# Patient Record
Sex: Male | Born: 1970
Health system: Southern US, Community
[De-identification: ages and names within clinical notes are randomized; demographics above are authoritative.]

## PROBLEM LIST (undated history)

## (undated) DIAGNOSIS — M5136 Other intervertebral disc degeneration, lumbar region: Secondary | ICD-10-CM

## (undated) DIAGNOSIS — M5126 Other intervertebral disc displacement, lumbar region: Secondary | ICD-10-CM

## (undated) HISTORY — PX: KNEE SURGERY: SHX244

## (undated) HISTORY — PX: BACK SURGERY: SHX140

---

## 2010-10-05 ENCOUNTER — Ambulatory Visit: Payer: Self-pay | Admitting: Family Medicine

## 2011-03-28 ENCOUNTER — Emergency Department: Payer: Self-pay | Admitting: Emergency Medicine

## 2012-12-31 ENCOUNTER — Emergency Department: Payer: Self-pay | Admitting: Emergency Medicine

## 2013-05-12 DIAGNOSIS — IMO0001 Reserved for inherently not codable concepts without codable children: Secondary | ICD-10-CM | POA: Diagnosis not present

## 2013-05-12 DIAGNOSIS — M545 Low back pain, unspecified: Secondary | ICD-10-CM | POA: Diagnosis not present

## 2013-05-12 DIAGNOSIS — Z5181 Encounter for therapeutic drug level monitoring: Secondary | ICD-10-CM | POA: Diagnosis not present

## 2013-05-12 DIAGNOSIS — Z79899 Other long term (current) drug therapy: Secondary | ICD-10-CM | POA: Diagnosis not present

## 2013-05-12 DIAGNOSIS — M62838 Other muscle spasm: Secondary | ICD-10-CM | POA: Diagnosis not present

## 2013-06-20 DIAGNOSIS — R109 Unspecified abdominal pain: Secondary | ICD-10-CM | POA: Diagnosis not present

## 2013-06-20 DIAGNOSIS — R630 Anorexia: Secondary | ICD-10-CM | POA: Diagnosis not present

## 2013-06-20 DIAGNOSIS — R112 Nausea with vomiting, unspecified: Secondary | ICD-10-CM | POA: Diagnosis not present

## 2013-08-18 DIAGNOSIS — M1710 Unilateral primary osteoarthritis, unspecified knee: Secondary | ICD-10-CM | POA: Diagnosis not present

## 2013-08-18 DIAGNOSIS — IMO0001 Reserved for inherently not codable concepts without codable children: Secondary | ICD-10-CM | POA: Diagnosis not present

## 2013-08-18 DIAGNOSIS — M62838 Other muscle spasm: Secondary | ICD-10-CM | POA: Diagnosis not present

## 2013-11-21 DIAGNOSIS — K219 Gastro-esophageal reflux disease without esophagitis: Secondary | ICD-10-CM | POA: Diagnosis not present

## 2013-11-21 DIAGNOSIS — R109 Unspecified abdominal pain: Secondary | ICD-10-CM | POA: Diagnosis not present

## 2013-11-21 DIAGNOSIS — M549 Dorsalgia, unspecified: Secondary | ICD-10-CM | POA: Diagnosis not present

## 2013-11-21 DIAGNOSIS — R079 Chest pain, unspecified: Secondary | ICD-10-CM | POA: Diagnosis not present

## 2013-11-23 DIAGNOSIS — G8929 Other chronic pain: Secondary | ICD-10-CM | POA: Diagnosis not present

## 2013-11-23 DIAGNOSIS — M549 Dorsalgia, unspecified: Secondary | ICD-10-CM | POA: Diagnosis not present

## 2013-12-01 DIAGNOSIS — Z79899 Other long term (current) drug therapy: Secondary | ICD-10-CM | POA: Diagnosis not present

## 2013-12-01 DIAGNOSIS — M545 Low back pain, unspecified: Secondary | ICD-10-CM | POA: Diagnosis not present

## 2013-12-01 DIAGNOSIS — Z5181 Encounter for therapeutic drug level monitoring: Secondary | ICD-10-CM | POA: Diagnosis not present

## 2013-12-15 DIAGNOSIS — M1710 Unilateral primary osteoarthritis, unspecified knee: Secondary | ICD-10-CM | POA: Diagnosis not present

## 2013-12-15 DIAGNOSIS — IMO0001 Reserved for inherently not codable concepts without codable children: Secondary | ICD-10-CM | POA: Diagnosis not present

## 2014-01-08 DIAGNOSIS — S83242A Other tear of medial meniscus, current injury, left knee, initial encounter: Secondary | ICD-10-CM | POA: Diagnosis not present

## 2014-01-08 DIAGNOSIS — M25562 Pain in left knee: Secondary | ICD-10-CM | POA: Diagnosis not present

## 2014-02-02 DIAGNOSIS — Z79891 Long term (current) use of opiate analgesic: Secondary | ICD-10-CM | POA: Diagnosis not present

## 2014-02-02 DIAGNOSIS — M545 Low back pain: Secondary | ICD-10-CM | POA: Diagnosis not present

## 2014-02-09 DIAGNOSIS — M545 Low back pain: Secondary | ICD-10-CM | POA: Diagnosis not present

## 2014-02-09 DIAGNOSIS — M5136 Other intervertebral disc degeneration, lumbar region: Secondary | ICD-10-CM | POA: Diagnosis not present

## 2014-02-09 DIAGNOSIS — M25562 Pain in left knee: Secondary | ICD-10-CM | POA: Diagnosis not present

## 2014-02-19 DIAGNOSIS — M23222 Derangement of posterior horn of medial meniscus due to old tear or injury, left knee: Secondary | ICD-10-CM | POA: Diagnosis not present

## 2014-03-09 DIAGNOSIS — Z01818 Encounter for other preprocedural examination: Secondary | ICD-10-CM | POA: Diagnosis not present

## 2014-03-09 DIAGNOSIS — M23222 Derangement of posterior horn of medial meniscus due to old tear or injury, left knee: Secondary | ICD-10-CM | POA: Diagnosis not present

## 2014-03-13 DIAGNOSIS — S83207A Unspecified tear of unspecified meniscus, current injury, left knee, initial encounter: Secondary | ICD-10-CM | POA: Diagnosis not present

## 2014-03-13 DIAGNOSIS — M6752 Plica syndrome, left knee: Secondary | ICD-10-CM | POA: Diagnosis not present

## 2014-03-13 DIAGNOSIS — M23352 Other meniscus derangements, posterior horn of lateral meniscus, left knee: Secondary | ICD-10-CM | POA: Diagnosis not present

## 2014-03-13 DIAGNOSIS — M23252 Derangement of posterior horn of lateral meniscus due to old tear or injury, left knee: Secondary | ICD-10-CM | POA: Diagnosis not present

## 2014-03-13 DIAGNOSIS — M6751 Plica syndrome, right knee: Secondary | ICD-10-CM | POA: Diagnosis not present

## 2014-03-13 DIAGNOSIS — M67462 Ganglion, left knee: Secondary | ICD-10-CM | POA: Diagnosis not present

## 2014-03-16 DIAGNOSIS — M25562 Pain in left knee: Secondary | ICD-10-CM | POA: Diagnosis not present

## 2014-03-16 DIAGNOSIS — M545 Low back pain: Secondary | ICD-10-CM | POA: Diagnosis not present

## 2014-03-20 DIAGNOSIS — M23222 Derangement of posterior horn of medial meniscus due to old tear or injury, left knee: Secondary | ICD-10-CM | POA: Diagnosis not present

## 2014-05-16 ENCOUNTER — Ambulatory Visit: Payer: Self-pay | Admitting: Family Medicine

## 2014-05-16 DIAGNOSIS — J40 Bronchitis, not specified as acute or chronic: Secondary | ICD-10-CM | POA: Diagnosis not present

## 2014-05-16 DIAGNOSIS — R0789 Other chest pain: Secondary | ICD-10-CM | POA: Diagnosis not present

## 2014-05-16 DIAGNOSIS — B349 Viral infection, unspecified: Secondary | ICD-10-CM | POA: Diagnosis not present

## 2014-05-16 DIAGNOSIS — R05 Cough: Secondary | ICD-10-CM | POA: Diagnosis not present

## 2014-05-16 DIAGNOSIS — R079 Chest pain, unspecified: Secondary | ICD-10-CM | POA: Diagnosis not present

## 2014-07-18 DIAGNOSIS — M25562 Pain in left knee: Secondary | ICD-10-CM | POA: Diagnosis not present

## 2014-08-03 DIAGNOSIS — M545 Low back pain: Secondary | ICD-10-CM | POA: Diagnosis not present

## 2014-08-03 DIAGNOSIS — Z79891 Long term (current) use of opiate analgesic: Secondary | ICD-10-CM | POA: Diagnosis not present

## 2014-08-03 DIAGNOSIS — M25562 Pain in left knee: Secondary | ICD-10-CM | POA: Diagnosis not present

## 2014-08-07 DIAGNOSIS — M545 Low back pain: Secondary | ICD-10-CM | POA: Diagnosis not present

## 2014-08-07 DIAGNOSIS — M25562 Pain in left knee: Secondary | ICD-10-CM | POA: Diagnosis not present

## 2014-08-29 DIAGNOSIS — M7652 Patellar tendinitis, left knee: Secondary | ICD-10-CM | POA: Diagnosis not present

## 2014-08-29 DIAGNOSIS — M25562 Pain in left knee: Secondary | ICD-10-CM | POA: Diagnosis not present

## 2014-12-14 DIAGNOSIS — M7652 Patellar tendinitis, left knee: Secondary | ICD-10-CM | POA: Diagnosis not present

## 2014-12-14 DIAGNOSIS — M5136 Other intervertebral disc degeneration, lumbar region: Secondary | ICD-10-CM | POA: Diagnosis not present

## 2014-12-14 DIAGNOSIS — Z79891 Long term (current) use of opiate analgesic: Secondary | ICD-10-CM | POA: Diagnosis not present

## 2014-12-14 DIAGNOSIS — M545 Low back pain: Secondary | ICD-10-CM | POA: Diagnosis not present

## 2014-12-14 DIAGNOSIS — M25562 Pain in left knee: Secondary | ICD-10-CM | POA: Diagnosis not present

## 2015-04-22 DIAGNOSIS — M5136 Other intervertebral disc degeneration, lumbar region: Secondary | ICD-10-CM | POA: Diagnosis not present

## 2015-04-22 DIAGNOSIS — M25562 Pain in left knee: Secondary | ICD-10-CM | POA: Diagnosis not present

## 2015-04-22 DIAGNOSIS — M7652 Patellar tendinitis, left knee: Secondary | ICD-10-CM | POA: Diagnosis not present

## 2015-04-22 DIAGNOSIS — M545 Low back pain: Secondary | ICD-10-CM | POA: Diagnosis not present

## 2015-07-19 DIAGNOSIS — M25562 Pain in left knee: Secondary | ICD-10-CM | POA: Diagnosis not present

## 2015-07-19 DIAGNOSIS — M7652 Patellar tendinitis, left knee: Secondary | ICD-10-CM | POA: Diagnosis not present

## 2015-07-19 DIAGNOSIS — M5136 Other intervertebral disc degeneration, lumbar region: Secondary | ICD-10-CM | POA: Diagnosis not present

## 2015-07-19 DIAGNOSIS — M545 Low back pain: Secondary | ICD-10-CM | POA: Diagnosis not present

## 2015-07-26 DIAGNOSIS — M47816 Spondylosis without myelopathy or radiculopathy, lumbar region: Secondary | ICD-10-CM | POA: Diagnosis not present

## 2015-07-26 DIAGNOSIS — Z981 Arthrodesis status: Secondary | ICD-10-CM | POA: Diagnosis not present

## 2015-07-26 DIAGNOSIS — M5137 Other intervertebral disc degeneration, lumbosacral region: Secondary | ICD-10-CM | POA: Diagnosis not present

## 2015-07-26 DIAGNOSIS — M5126 Other intervertebral disc displacement, lumbar region: Secondary | ICD-10-CM | POA: Diagnosis not present

## 2015-07-26 DIAGNOSIS — M5136 Other intervertebral disc degeneration, lumbar region: Secondary | ICD-10-CM | POA: Diagnosis not present

## 2015-08-16 DIAGNOSIS — M5136 Other intervertebral disc degeneration, lumbar region: Secondary | ICD-10-CM | POA: Diagnosis not present

## 2015-11-15 DIAGNOSIS — Z79891 Long term (current) use of opiate analgesic: Secondary | ICD-10-CM | POA: Diagnosis not present

## 2015-11-15 DIAGNOSIS — M5136 Other intervertebral disc degeneration, lumbar region: Secondary | ICD-10-CM | POA: Diagnosis not present

## 2015-12-13 DIAGNOSIS — M5136 Other intervertebral disc degeneration, lumbar region: Secondary | ICD-10-CM | POA: Diagnosis not present

## 2016-01-30 IMAGING — CR DG CHEST 2V
2 series · 2 of 2 positions shown · non-contrast
Comparison: None

CLINICAL DATA: Cough for 1 day getting worse, chest pain and
tightness

EXAM:
CHEST  2 VIEW

[chest pa]
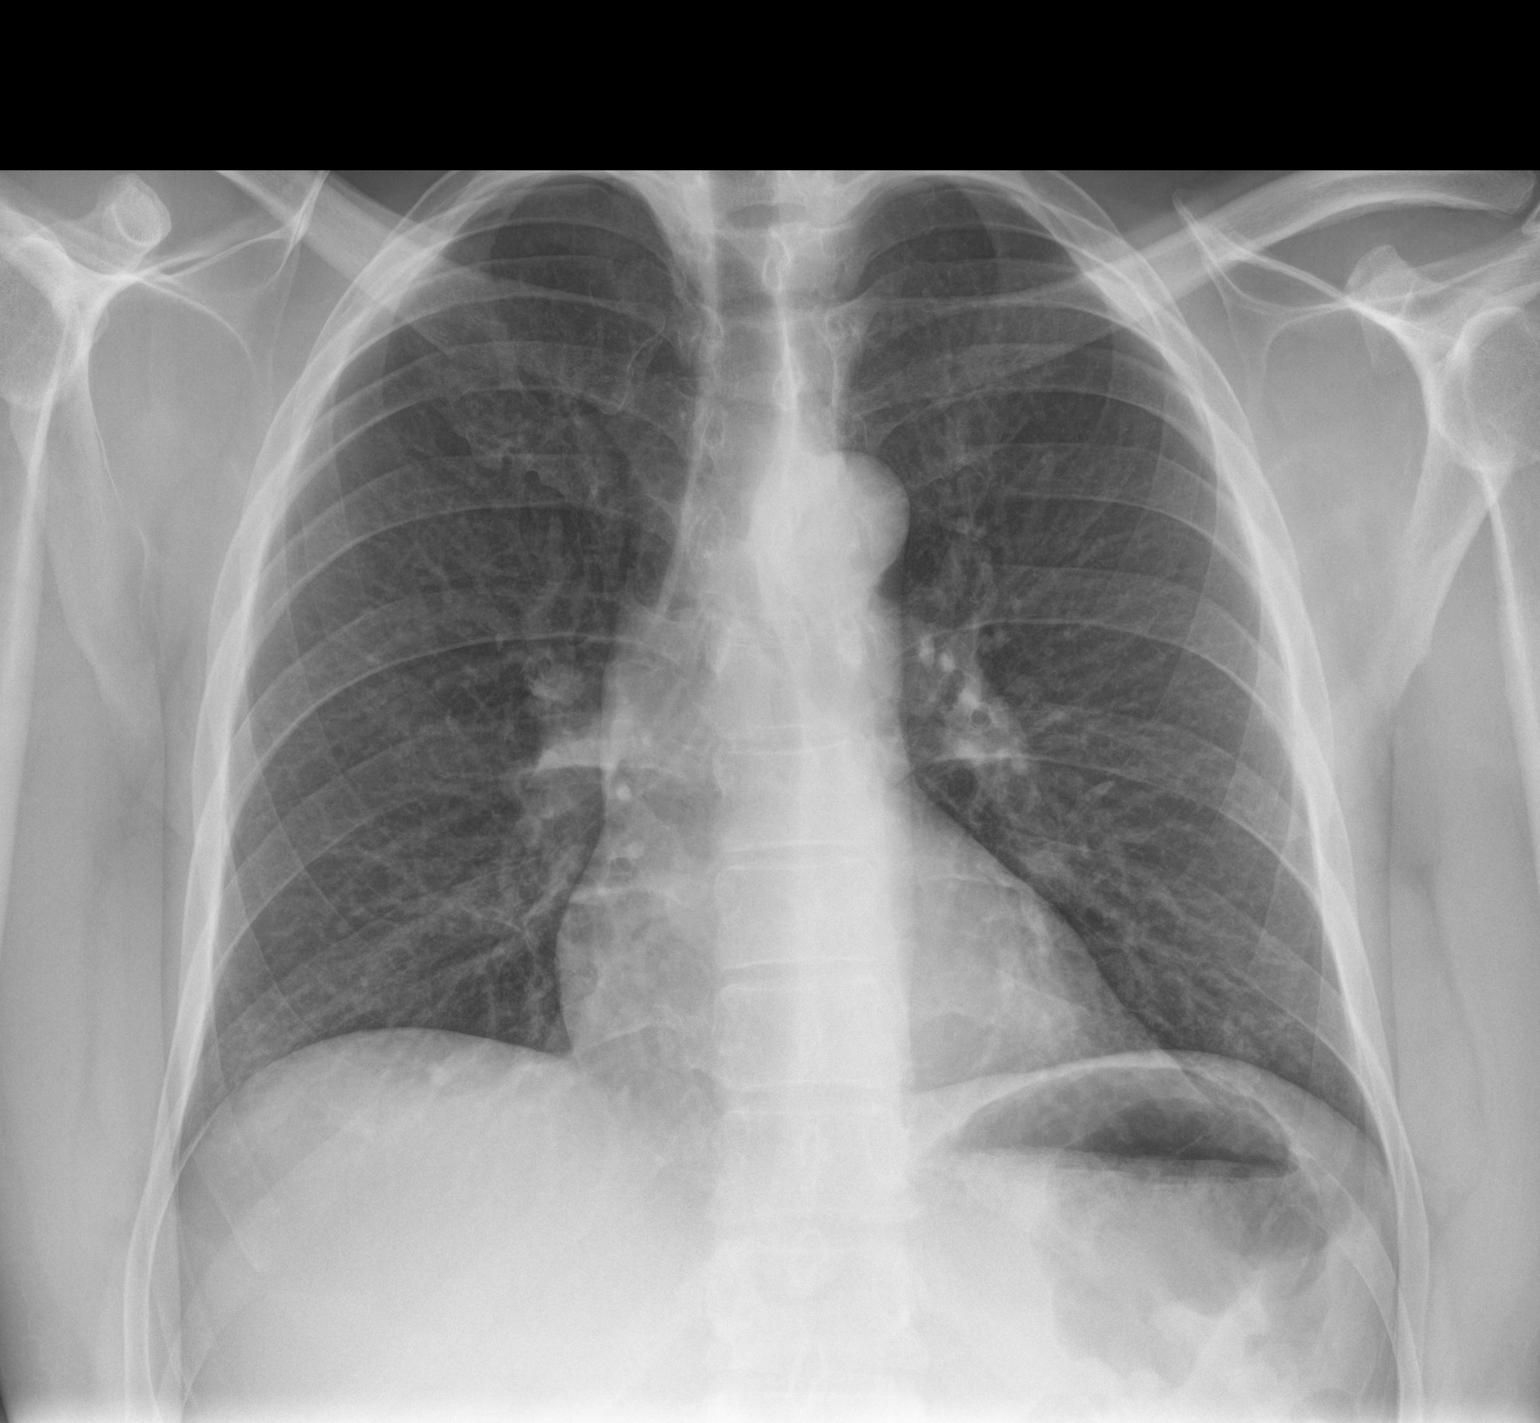

[chest lat]
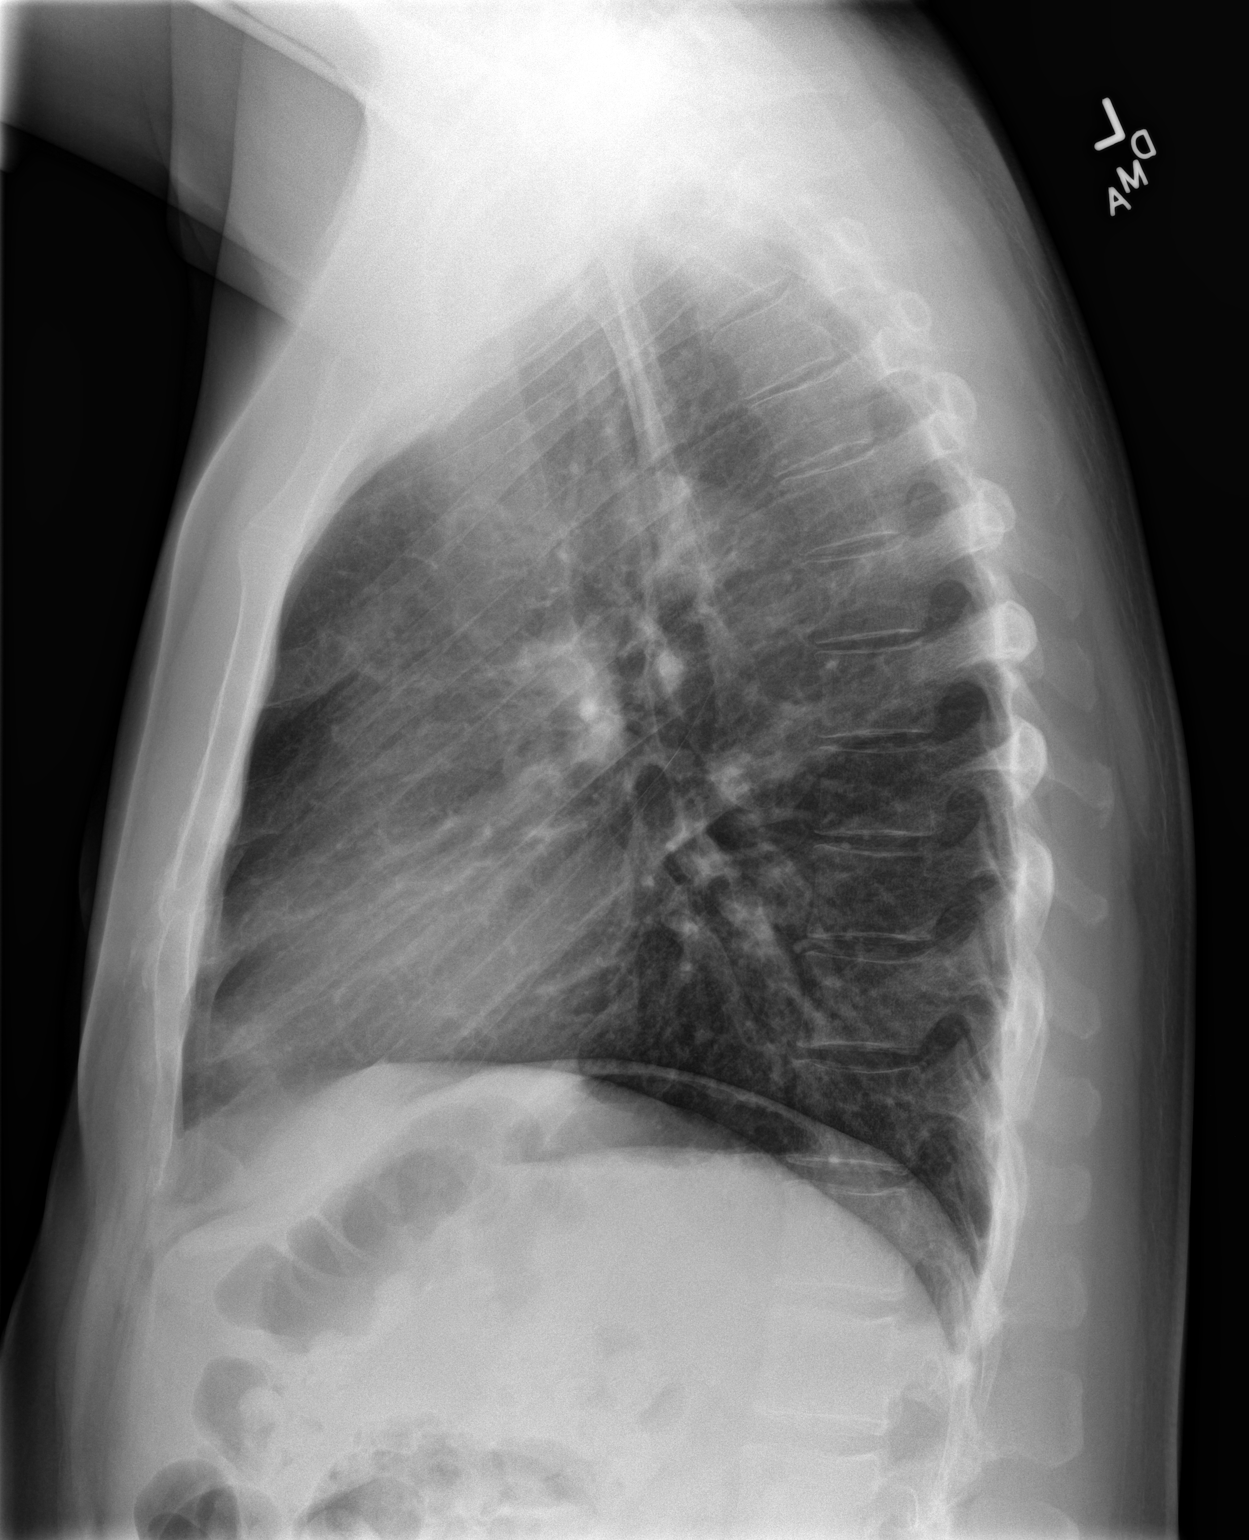

[2 of 2 positions shown; findings below may reference images not displayed]

FINDINGS: Normal heart size, mediastinal contours, and pulmonary vascularity.

Mild peribronchial thickening.

No pulmonary infiltrate, pleural effusion, or pneumothorax.

Bones unremarkable.
IMPRESSION: Mild bronchitic changes without infiltrate.

## 2016-03-02 DIAGNOSIS — M25562 Pain in left knee: Secondary | ICD-10-CM | POA: Diagnosis not present

## 2016-05-07 ENCOUNTER — Ambulatory Visit
Admission: EM | Admit: 2016-05-07 | Discharge: 2016-05-07 | Disposition: A | Discharge status: Home / Self Care | Payer: Medicare Other | Attending: Family Medicine | Admitting: Family Medicine

## 2016-05-07 ENCOUNTER — Encounter: Payer: Self-pay | Admitting: *Deleted

## 2016-05-07 DIAGNOSIS — H6982 Other specified disorders of Eustachian tube, left ear: Secondary | ICD-10-CM

## 2016-05-07 DIAGNOSIS — H9202 Otalgia, left ear: Secondary | ICD-10-CM | POA: Diagnosis not present

## 2016-05-07 MED ORDER — FLUTICASONE PROPIONATE 50 MCG/ACT NA SUSP: 2.0000 | Freq: Every day | NASAL | 2 refills | Status: DC

## 2016-05-07 MED ORDER — AMOXICILLIN-POT CLAVULANATE 875-125 MG PO TABS: 1.0000 | ORAL_TABLET | Freq: Two times a day (BID) | ORAL | 0 refills | Status: DC

## 2016-05-07 MED ORDER — FEXOFENADINE-PSEUDOEPHED ER 180-240 MG PO TB24: 1.0000 | ORAL_TABLET | Freq: Every day | ORAL | 0 refills | Status: DC

## 2016-05-07 MED ORDER — PREDNISONE 10 MG (21) PO TBPK: ORAL_TABLET | ORAL | 0 refills | Status: DC

## 2016-05-07 NOTE — ED Provider Notes (Signed)
[facility]    CSN: [id number] Arrival date & time: 05/07/16  1000     History   Chief Complaint Chief Complaint  Patient presents with  . Otalgia    HPI Larry Torres is a 46 y.o. male.   Patient is a 46 year old black male with pain in the left ear. States that pain the ear started about 2 days ago. The pain is been sharp in his left ear started having some discomfort on Monday and is progressively gotten worse. He denies any fever. No history of recurrent ear infections or sinus problems in the past. He's not allergic to anything. He does not smoke. He's had chronic back pain from an injury from work and is on MarriottPercocet and Flexeri and acetaminophen on a regular basis. No past family medical history pertinent today's visit.     Otalgia    History reviewed. No pertinent past medical history.  There are no active problems to display for this patient.   Past Surgical History:  Procedure Laterality Date  . BACK SURGERY    . KNEE SURGERY         Home Medications    Prior to Admission medications   Medication Sig Start Date End Date Taking? Authorizing Provider  acetaminophen (TYLENOL) 650 MG CR tablet Take 650 mg by mouth daily.   Yes Historical Provider, MD  cyclobenzaprine (FLEXERIL) 10 MG tablet Take 10 mg by mouth at bedtime.   Yes Historical Provider, MD  oxyCODONE (ROXICODONE) 15 MG immediate release tablet Take 15 mg by mouth 3 (three) times daily.   Yes Historical Provider, MD  amoxicillin-clavulanate (AUGMENTIN) 875-125 MG tablet Take 1 tablet by mouth 2 (two) times daily. Please filled between 05/09/2016 and 06/06/2016 not improving 05/07/16   Hassan RowanEugene Cherese Lozano, MD  fexofenadine-pseudoephedrine (ALLEGRA-D ALLERGY & CONGESTION) 180-240 MG 24 hr tablet Take 1 tablet by mouth daily. 05/07/16   Hassan RowanEugene Delvon Chipps, MD  fluticasone (FLONASE) 50 MCG/ACT nasal spray Place 2 sprays into both nostrils daily. 05/07/16   Hassan RowanEugene Strummer Canipe, MD  predniSONE (STERAPRED UNI-PAK 21 TAB)  10 MG (21) TBPK tablet Sig 6 tablet day 1, 5 tablets day 2, 4 tablets day 3,,3tablets day 4, 2 tablets day 5, 1 tablet day 6 take all tablets orally 05/07/16   Hassan RowanEugene Archer Moist, MD    Family History History reviewed. No pertinent family history.  Social History Social History  Substance Use Topics  . Smoking status: Never Smoker  . Smokeless tobacco: Never Used  . Alcohol use No     Allergies   Patient has no known allergies.   Review of Systems Review of Systems  HENT: Positive for ear pain.   All other systems reviewed and are negative.    Physical Exam Triage Vital Signs ED Triage Vitals  Enc Vitals Group     BP 05/07/16 1029 125/62     Pulse Rate 05/07/16 1029 81     Resp 05/07/16 1029 15     Temp 05/07/16 1029 97.5 F (36.4 C)     Temp Source 05/07/16 1029 Oral     SpO2 05/07/16 1029 98 %     Weight 05/07/16 1031 201 lb (91.2 kg)     Height 05/07/16 1031 6' (1.829 m)     Head Circumference --      Peak Flow --      Pain Score 05/07/16 1035 9     Pain Loc --      Pain Edu? --  Excl. in GC? --    No data found.   Updated Vital Signs BP 125/62 (BP Location: Left Arm)   Pulse 81   Temp 97.5 F (36.4 C) (Oral)   Resp 15   Ht 6' (1.829 m)   Wt 201 lb (91.2 kg)   SpO2 98%   BMI 27.26 kg/m   Visual Acuity Right Eye Distance:   Left Eye Distance:   Bilateral Distance:    Right Eye Near:   Left Eye Near:    Bilateral Near:     Physical Exam  Constitutional: He is oriented to person, place, and time. He appears well-developed and well-nourished. No distress.  HENT:  Head: Normocephalic and atraumatic.  Right Ear: Hearing, tympanic membrane, external ear and ear canal normal.  Left Ear: External ear and ear canal normal. Tympanic membrane is bulging. Tympanic membrane is not erythematous and not retracted.  Nose: Mucosal edema present.  Mouth/Throat: Uvula is midline and oropharynx is clear and moist. No uvula swelling.  Eyes: Pupils are equal,  round, and reactive to light.  Neck: Normal range of motion. Neck supple.  Pulmonary/Chest: Effort normal.  Musculoskeletal: Normal range of motion.  Neurological: He is alert and oriented to person, place, and time.  Skin: Skin is warm and dry.  Psychiatric: He has a normal mood and affect.  Vitals reviewed.    UC Treatments / Results  Labs (all labs ordered are listed, but only abnormal results are displayed) Labs Reviewed - No data to display  EKG  EKG Interpretation None       Radiology No results found.  Procedures Procedures (including critical care time)  Medications Ordered in UC Medications - No data to display   Initial Impression / Assessment and Plan / UC Course  I have reviewed the triage vital signs and the nursing notes.  Pertinent labs & imaging results that were available during my care of the patient were reviewed by me and considered in my medical decision making (see chart for details).     Patient has pain in the left ear but no signs of acute otitis of the left ear at this time. He is tender around the left ear on the 12th posterior and anterior area inferior to the ear consistent with eustachian tube dysfunction. When patient is a Valsalva maneuver he could tell the difference to the 2 years with the left ear not being able to pop like the right. We'll give him a prescription for Allegra-D 60 course of prednisone Flonase nasal spray 2 puffs daily and a prescription for Augmentin which recommending he wait until 2 days Saturday before filling and see if he does get better with the other medications.  Final Clinical Impressions(s) / UC Diagnoses   Final diagnoses:  Otalgia of left ear  Eustachian tube dysfunction, left    New Prescriptions New Prescriptions   AMOXICILLIN-CLAVULANATE (AUGMENTIN) 875-125 MG TABLET    Take 1 tablet by mouth 2 (two) times daily. Please filled between 05/09/2016 and 06/06/2016 not improving    FEXOFENADINE-PSEUDOEPHEDRINE (ALLEGRA-D ALLERGY & CONGESTION) 180-240 MG 24 HR TABLET    Take 1 tablet by mouth daily.   FLUTICASONE (FLONASE) 50 MCG/ACT NASAL SPRAY    Place 2 sprays into both nostrils daily.   PREDNISONE (STERAPRED UNI-PAK 21 TAB) 10 MG (21) TBPK TABLET    Sig 6 tablet day 1, 5 tablets day 2, 4 tablets day 3,,3tablets day 4, 2 tablets day 5, 1 tablet day 6 take all tablets  orally      Note: This dictation was prepared with Dragon dictation along with smaller phrase technology. Any transcriptional errors that result from this process are unintentional.   Hassan Rowan, MD 05/07/16 1219

## 2016-05-07 NOTE — ED Triage Notes (Signed)
Patient started having left ear pain 2 days ago after attending a sporting event. Patient reports that the spectator to his left had a high pitch squeal that may be the reason for his ear pain.

## 2016-06-05 DIAGNOSIS — M25562 Pain in left knee: Secondary | ICD-10-CM | POA: Diagnosis not present

## 2016-06-05 DIAGNOSIS — M545 Low back pain: Secondary | ICD-10-CM | POA: Diagnosis not present

## 2016-06-05 DIAGNOSIS — Z79899 Other long term (current) drug therapy: Secondary | ICD-10-CM | POA: Diagnosis not present

## 2016-06-05 DIAGNOSIS — Z79891 Long term (current) use of opiate analgesic: Secondary | ICD-10-CM | POA: Diagnosis not present

## 2016-08-21 DIAGNOSIS — M25562 Pain in left knee: Secondary | ICD-10-CM | POA: Diagnosis not present

## 2016-08-21 DIAGNOSIS — Z79899 Other long term (current) drug therapy: Secondary | ICD-10-CM | POA: Diagnosis not present

## 2016-08-21 DIAGNOSIS — M545 Low back pain: Secondary | ICD-10-CM | POA: Diagnosis not present

## 2016-11-27 DIAGNOSIS — Z79891 Long term (current) use of opiate analgesic: Secondary | ICD-10-CM | POA: Diagnosis not present

## 2016-11-27 DIAGNOSIS — Z79899 Other long term (current) drug therapy: Secondary | ICD-10-CM | POA: Diagnosis not present

## 2016-11-27 DIAGNOSIS — M5136 Other intervertebral disc degeneration, lumbar region: Secondary | ICD-10-CM | POA: Diagnosis not present

## 2016-11-27 DIAGNOSIS — M25562 Pain in left knee: Secondary | ICD-10-CM | POA: Diagnosis not present

## 2016-11-27 DIAGNOSIS — M545 Low back pain: Secondary | ICD-10-CM | POA: Diagnosis not present

## 2017-02-12 DIAGNOSIS — M25562 Pain in left knee: Secondary | ICD-10-CM | POA: Diagnosis not present

## 2017-02-12 DIAGNOSIS — M545 Low back pain: Secondary | ICD-10-CM | POA: Diagnosis not present

## 2017-02-12 DIAGNOSIS — M2550 Pain in unspecified joint: Secondary | ICD-10-CM | POA: Diagnosis not present

## 2017-02-12 DIAGNOSIS — Z79891 Long term (current) use of opiate analgesic: Secondary | ICD-10-CM | POA: Diagnosis not present

## 2017-04-11 ENCOUNTER — Ambulatory Visit
Admission: EM | Admit: 2017-04-11 | Discharge: 2017-04-11 | Disposition: A | Discharge status: Home / Self Care | Payer: Medicare Other | Attending: Emergency Medicine | Admitting: Emergency Medicine

## 2017-04-11 ENCOUNTER — Other Ambulatory Visit: Payer: Self-pay

## 2017-04-11 DIAGNOSIS — R3915 Urgency of urination: Secondary | ICD-10-CM | POA: Insufficient documentation

## 2017-04-11 DIAGNOSIS — N342 Other urethritis: Secondary | ICD-10-CM | POA: Insufficient documentation

## 2017-04-11 DIAGNOSIS — N390 Urinary tract infection, site not specified: Secondary | ICD-10-CM | POA: Diagnosis not present

## 2017-04-11 DIAGNOSIS — R319 Hematuria, unspecified: Secondary | ICD-10-CM | POA: Diagnosis not present

## 2017-04-11 DIAGNOSIS — R300 Dysuria: Secondary | ICD-10-CM | POA: Diagnosis not present

## 2017-04-11 HISTORY — DX: Other intervertebral disc displacement, lumbar region: M51.26

## 2017-04-11 HISTORY — DX: Other intervertebral disc degeneration, lumbar region: M51.36

## 2017-04-11 LAB — URINALYSIS, COMPLETE (UACMP) WITH MICROSCOPIC
BACTERIA UA: NONE SEEN
Bilirubin Urine: NEGATIVE
GLUCOSE, UA: NEGATIVE mg/dL
Ketones, ur: NEGATIVE mg/dL
Leukocytes, UA: NEGATIVE
Nitrite: NEGATIVE
PROTEIN: NEGATIVE mg/dL
SQUAMOUS EPITHELIAL / LPF: NONE SEEN
Specific Gravity, Urine: 1.015 (ref 1.005–1.030)
pH: 7.5 (ref 5.0–8.0)

## 2017-04-11 MED ORDER — IBUPROFEN 600 MG PO TABS: 600.0000 mg | ORAL_TABLET | Freq: Four times a day (QID) | ORAL | 0 refills | Status: AC | PRN

## 2017-04-11 MED ORDER — PHENAZOPYRIDINE HCL 200 MG PO TABS: 200.0000 mg | ORAL_TABLET | Freq: Three times a day (TID) | ORAL | 0 refills | Status: AC | PRN

## 2017-04-11 MED ORDER — TAMSULOSIN HCL 0.4 MG PO CAPS: 0.4000 mg | ORAL_CAPSULE | Freq: Every day | ORAL | 0 refills | Status: AC

## 2017-04-11 NOTE — ED Provider Notes (Addendum)
HPI  SUBJECTIVE:  Larry Torres is a 47 y.o. male who presents with a little more than a week of dysuria, urgency, frequency, cloudy odorous urine.  He reports burning with urination.  He reports intermittent, seconds long low midline abdominal pain described as soreness which is present primarily after urinating.  He tried increasing fluids and Tylenol without improvement in his symptoms.  Symptoms are worse when he holds his urine.  No fevers, back pain, perineal pain, pelvic pain.  No vomiting.  No penile rash, discharge, testicular swelling or pain, scrotal erythema, edema, pain.  No antibiotics in the past month.  No Tylenol or ibuprofen in the past 6-8 hours.  He has never had symptoms like this before.  He is in a long-term monogamous relationship with his wife who is asymptomatic.  STDs are not a concern today.  He denies pain with ejaculation.  Past medical history negative for diabetes, hypertension, UTI, pyelonephritis nephrolithiasis, epididymitis, orchitis, prostatitis.  No history of gonorrhea, chlamydia, HIV, HSV, syphilis, Trichomonas.  PMD: None    Past Medical History:  Diagnosis Date  . Bulging lumbar disc     Past Surgical History:  Procedure Laterality Date  . BACK SURGERY    . BACK SURGERY    . KNEE SURGERY      Family History  Problem Relation Age of Onset  . Cancer Mother   . Cancer Father     Social History   Tobacco Use  . Smoking status: Never Smoker  . Smokeless tobacco: Never Used  Substance Use Topics  . Alcohol use: No  . Drug use: No    No current facility-administered medications for this encounter.   Current Outpatient Medications:  .  phenazopyridine (PYRIDIUM) 200 MG tablet, Take 1 tablet (200 mg total) by mouth 3 (three) times daily as needed for pain., Disp: 6 tablet, Rfl: 0  No Known Allergies   ROS  As noted in HPI.   Physical Exam  BP 125/77 (BP Location: Left Arm)   Pulse 76   Temp 98.1 F (36.7 C) (Oral)   Resp 18    Ht 6' (1.829 m)   Wt 208 lb (94.3 kg)   SpO2 100%   BMI 28.21 kg/m   Constitutional: Well developed, well nourished, no acute distress Eyes:  EOMI, conjunctiva normal bilaterally HENT: Normocephalic, atraumatic,mucus membranes moist Respiratory: Normal inspiratory effort Cardiovascular: Normal rate GI: nondistended.  Soft.  Mild suprapubic tenderness.  No flank tenderness. Back: No CVA tenderness GU: Normal circumcised male, testes descended bilaterally.  No penile rash, discharge.  Testes, epididymis nontender.  No inguinal lymphadenopathy.  Patient declined chaperone. Rectal: Normal nontender not enlarged firm prostate.  Patient declined chaperone skin: No rash, skin intact Musculoskeletal: no deformities Neurologic: Alert & oriented x 3, no focal neuro deficits Psychiatric: Speech and behavior appropriate   ED Course   Medications - No data to display  Orders Placed This Encounter  Procedures  . Chlamydia/NGC rt PCR    Standing Status:   Standing    Number of Occurrences:   1    Order Specific Question:   Patient immune status    Answer:   Normal  . Urine culture    Standing Status:   Standing    Number of Occurrences:   1    Order Specific Question:   Patient immune status    Answer:   Normal  . Urinalysis, Complete w Microscopic    Standing Status:   Standing  Number of Occurrences:   1    Results for orders placed or performed during the hospital encounter of 04/11/17 (from the past 24 hour(s))  Urinalysis, Complete w Microscopic     Status: Abnormal   Collection Time: 04/11/17 11:11 AM  Result Value Ref Range   Color, Urine YELLOW YELLOW   APPearance CLEAR CLEAR   Specific Gravity, Urine 1.015 1.005 - 1.030   pH 7.5 5.0 - 8.0   Glucose, UA NEGATIVE NEGATIVE mg/dL   Hgb urine dipstick TRACE (A) NEGATIVE   Bilirubin Urine NEGATIVE NEGATIVE   Ketones, ur NEGATIVE NEGATIVE mg/dL   Protein, ur NEGATIVE NEGATIVE mg/dL   Nitrite NEGATIVE NEGATIVE    Leukocytes, UA NEGATIVE NEGATIVE   Squamous Epithelial / LPF NONE SEEN NONE SEEN   WBC, UA 6-30 0 - 5 WBC/hpf   RBC / HPF 0-5 0 - 5 RBC/hpf   Bacteria, UA NONE SEEN NONE SEEN   No results found.  ED Clinical Impression  Dysuria  Urethritis   ED Assessment/Plan  Urine with some pyuria, trace hematuria.  No bacteria.  No UTI.   Since patient did not provide a dirty urine sample, obtained a urethral swab for gonorrhea and chlamydia.  Feel that He is low risk enough that we will defer treatment today.  Doubt trichomonas so testing for this was not done.  We will send his urine off for culture to confirm the absence of UTI.  Otherwise, presentation consistent with a urethritis.  Does not appear to be prostatitis, orchitis, epididymitis.  Will send home with flomax in case this is BPH, Pyridium, ibuprofen 600 mg to take with 1 g of Tylenol 3-4 times a day as needed for pain, continue pushing fluids.  Will provide a primary care referral or may return here in 1-2 weeks for repeat urinalysis to make sure that the hematuria has cleared.  Consider testing for Trichomonas and yeast if he is still symptomatic on return visit.  He will go to the ER if he gets worse.  Discussed labs,  MDM, plan and followup with patient. Discussed sn/sx that should prompt return to the ED. patient agrees with plan.     Meds ordered this encounter  Medications  . phenazopyridine (PYRIDIUM) 200 MG tablet    Sig: Take 1 tablet (200 mg total) by mouth 3 (three) times daily as needed for pain.    Dispense:  6 tablet    Refill:  0   05/01/2017. 1610  Patient's urethral swab was positive for chlamydia, negative for gonorrhea.  Will have staff notify patient and will have them call in azithromycin 1 g p.o. x1 to patient's pharmacy of choice.  Also advised them to tell patient that his partner or partners will need to come in to be tested and treated.  Attempted to contact patient at the end of the day as I understand that  he had some questions for me.  Called and left message at [phone]  05/02/2017. 1729.  Again attempted to call patient at [phone].  Left message.   *This clinic note was created using Dragon dictation software. Therefore, there may be occasional mistakes despite careful proofreading.   ?   Domenick Gong, MD 04/11/17 1246    Domenick Gong, MD 05/01/17 2130    Domenick Gong, MD 05/02/17 1730

## 2017-04-11 NOTE — Discharge Instructions (Signed)
Continue pushing plenty of fluids.  600 mg of ibuprofen with 1 g of Tylenol 3-4 times a day as needed for pain.  Try the Pyridium.  This will help with your urinary symptoms.  The Flomax may help as well.  Return here or follow-up with a primary care physician of your choice to have repeat urinalysis in 1 week.  See list below  Here is a list of primary care providers who are taking new patients:  Dr. Elizabeth Sauereanna Jones, Dr. Schuyler AmorWilliam Plonk [address] [place] [address] [phone]  [facility] [place] [address]  [place] [address]  [phone]  [facility] [address]  [place], [place] [address] [phone]  [facility] [place] [address]  [phone] [place], [place] [address]  Here are clinics/ other resources who will see you if you do not have insurance. Some have certain criteria that you must meet. Call them and find out what they are:  [facility]: [address] [place] [place], [place] [address] Phone: [phone] Hours: First and Third Saturdays of each Month, 9 a.m. - 1 p.m.  [facility]: [address], [place], [place] [address] Phone: [phone] Hours: Tuesday, 4 p.m. - 8 p.m. Thursday, 1 p.m. - 8 p.m. Wednesday, 9 a.m. - [facility]  [facility] [address], [place], [place] [address] Phone: [phone] Pharmacy Phone Number: [phone] Dental Phone Number: [phone] [facility] Insurance Help: [phone]  Dental Hours: Monday - Thursday, 8 a.m. - 6 p.m.  [facility] [address] [place], [place] [address] Phone: [phone] Pharmacy Phone Number: [phone] [facility] Insurance Help: [phone]  [facility] [address] [place], [place] [address] Phone: [phone] Pharmacy Phone Number: [phone] [facility] Insurance Help: [phone]  [facility] [address] [place], [place] [address] Phone:  [phone] [facility] Insurance Help: [phone]   [facility] [address] [place], [place] [address] Phone: [phone]  Go to www.goodrx.com to look up your medications. This will give you a list of where you can find your prescriptions at the most affordable prices. Or ask the pharmacist what the cash price is, or if they have any other discount programs available to help make your medication more affordable. This can be less expensive than what you would pay with insurance.

## 2017-04-11 NOTE — ED Triage Notes (Signed)
Pt with two days of burning with urination and frequency. Also reports he thinks his urine looks a little cloudy

## 2017-04-13 ENCOUNTER — Telehealth: Payer: Self-pay | Admitting: Emergency Medicine

## 2017-04-13 LAB — URINE CULTURE
CULTURE: NO GROWTH
Special Requests: NORMAL

## 2017-04-13 NOTE — Telephone Encounter (Signed)
Patient notified of his urine culture result.  Patient states that he is feeling much better after starting the medicine.

## 2017-04-15 LAB — MISC LABCORP TEST (SEND OUT): LABCORP TEST CODE: 183194

## 2017-05-01 ENCOUNTER — Telehealth: Payer: Self-pay

## 2017-05-01 MED ORDER — AZITHROMYCIN 500 MG PO TABS: 1000.0000 mg | ORAL_TABLET | Freq: Once | ORAL | 0 refills | Status: AC

## 2017-05-01 NOTE — Telephone Encounter (Signed)
Spoke with pt. Pt. Advised of results and verbalized understanding. [facility]. Patient instructed to inform partner of results and encourage partner treatment. Form faxed back to [facility].

## 2017-05-01 NOTE — Progress Notes (Signed)
05/01/2017 0820-.  Patient's urethral swab positive for chlamydia, negative for gonorrhea.  Will have staff call and azithromycin 1 g p.o. x1 to the patient's pharmacy of choice.  Advised staff that they will need to tell the patient that his partner or partners will need to come in and be tested and treated.  AM

## 2017-05-07 DIAGNOSIS — G894 Chronic pain syndrome: Secondary | ICD-10-CM | POA: Diagnosis not present

## 2017-05-07 DIAGNOSIS — M545 Low back pain: Secondary | ICD-10-CM | POA: Diagnosis not present

## 2017-05-10 DIAGNOSIS — Z7289 Other problems related to lifestyle: Secondary | ICD-10-CM | POA: Diagnosis not present

## 2017-05-10 DIAGNOSIS — Z114 Encounter for screening for human immunodeficiency virus [HIV]: Secondary | ICD-10-CM | POA: Diagnosis not present

## 2017-05-10 DIAGNOSIS — R300 Dysuria: Secondary | ICD-10-CM | POA: Diagnosis not present

## 2017-05-13 DIAGNOSIS — R300 Dysuria: Secondary | ICD-10-CM | POA: Diagnosis not present

## 2017-05-13 DIAGNOSIS — Z114 Encounter for screening for human immunodeficiency virus [HIV]: Secondary | ICD-10-CM | POA: Diagnosis not present

## 2017-07-09 DIAGNOSIS — M5416 Radiculopathy, lumbar region: Secondary | ICD-10-CM | POA: Diagnosis not present

## 2017-09-03 DIAGNOSIS — Z79891 Long term (current) use of opiate analgesic: Secondary | ICD-10-CM | POA: Diagnosis not present

## 2017-09-03 DIAGNOSIS — M25569 Pain in unspecified knee: Secondary | ICD-10-CM | POA: Diagnosis not present

## 2017-09-03 DIAGNOSIS — G894 Chronic pain syndrome: Secondary | ICD-10-CM | POA: Diagnosis not present

## 2017-10-29 DIAGNOSIS — G894 Chronic pain syndrome: Secondary | ICD-10-CM | POA: Diagnosis not present

## 2017-10-29 DIAGNOSIS — M5416 Radiculopathy, lumbar region: Secondary | ICD-10-CM | POA: Diagnosis not present

## 2017-12-31 DIAGNOSIS — M25569 Pain in unspecified knee: Secondary | ICD-10-CM | POA: Diagnosis not present

## 2017-12-31 DIAGNOSIS — G894 Chronic pain syndrome: Secondary | ICD-10-CM | POA: Diagnosis not present

## 2017-12-31 DIAGNOSIS — M545 Low back pain: Secondary | ICD-10-CM | POA: Diagnosis not present

## 2017-12-31 DIAGNOSIS — M5416 Radiculopathy, lumbar region: Secondary | ICD-10-CM | POA: Diagnosis not present

## 2018-02-12 DIAGNOSIS — Z23 Encounter for immunization: Secondary | ICD-10-CM | POA: Diagnosis not present

## 2018-02-12 DIAGNOSIS — M7712 Lateral epicondylitis, left elbow: Secondary | ICD-10-CM | POA: Diagnosis not present

## 2018-02-22 DIAGNOSIS — M25522 Pain in left elbow: Secondary | ICD-10-CM | POA: Diagnosis not present

## 2018-02-22 DIAGNOSIS — M7712 Lateral epicondylitis, left elbow: Secondary | ICD-10-CM | POA: Diagnosis not present

## 2018-03-01 DIAGNOSIS — M25522 Pain in left elbow: Secondary | ICD-10-CM | POA: Diagnosis not present

## 2018-03-01 DIAGNOSIS — M7712 Lateral epicondylitis, left elbow: Secondary | ICD-10-CM | POA: Diagnosis not present

## 2018-03-04 DIAGNOSIS — Z5181 Encounter for therapeutic drug level monitoring: Secondary | ICD-10-CM | POA: Diagnosis not present

## 2018-03-04 DIAGNOSIS — Z79899 Other long term (current) drug therapy: Secondary | ICD-10-CM | POA: Diagnosis not present

## 2018-03-04 DIAGNOSIS — M5416 Radiculopathy, lumbar region: Secondary | ICD-10-CM | POA: Diagnosis not present

## 2018-03-04 DIAGNOSIS — M25561 Pain in right knee: Secondary | ICD-10-CM | POA: Diagnosis not present

## 2018-03-04 DIAGNOSIS — M170 Bilateral primary osteoarthritis of knee: Secondary | ICD-10-CM | POA: Diagnosis not present

## 2018-03-04 DIAGNOSIS — G894 Chronic pain syndrome: Secondary | ICD-10-CM | POA: Diagnosis not present

## 2018-03-15 DIAGNOSIS — M25522 Pain in left elbow: Secondary | ICD-10-CM | POA: Diagnosis not present

## 2018-03-15 DIAGNOSIS — M7712 Lateral epicondylitis, left elbow: Secondary | ICD-10-CM | POA: Diagnosis not present

## 2018-03-29 DIAGNOSIS — M25522 Pain in left elbow: Secondary | ICD-10-CM | POA: Diagnosis not present

## 2018-03-29 DIAGNOSIS — M7712 Lateral epicondylitis, left elbow: Secondary | ICD-10-CM | POA: Diagnosis not present
